# Patient Record
Sex: Female | Born: 1950
Health system: Southern US, Community
[De-identification: ages and names within clinical notes are randomized; demographics above are authoritative.]

## PROBLEM LIST (undated history)

## (undated) DIAGNOSIS — M792 Neuralgia and neuritis, unspecified: Secondary | ICD-10-CM

## (undated) DIAGNOSIS — N200 Calculus of kidney: Secondary | ICD-10-CM

## (undated) HISTORY — DX: Neuralgia and neuritis, unspecified: M79.2

## (undated) HISTORY — PX: HYSTEROTOMY: SHX1776

## (undated) HISTORY — DX: Calculus of kidney: N20.0

## (undated) HISTORY — PX: ECTOPIC PREGNANCY SURGERY: SHX613

## (undated) HISTORY — PX: OTHER SURGICAL HISTORY: SHX169

---

## 1998-08-27 ENCOUNTER — Encounter: Payer: Self-pay | Admitting: Family Medicine

## 1998-08-28 ENCOUNTER — Inpatient Hospital Stay (HOSPITAL_COMMUNITY): Admission: EM | Admit: 1998-08-28 | Discharge: 1998-08-30 | Payer: Self-pay | Admitting: Emergency Medicine

## 1998-08-28 ENCOUNTER — Encounter: Payer: Self-pay | Admitting: Family Medicine

## 1999-05-17 ENCOUNTER — Encounter: Admission: RE | Admit: 1999-05-17 | Discharge: 1999-05-17 | Payer: Self-pay | Admitting: Urology

## 1999-05-17 ENCOUNTER — Encounter: Payer: Self-pay | Admitting: Urology

## 2005-02-27 ENCOUNTER — Other Ambulatory Visit: Admission: RE | Admit: 2005-02-27 | Discharge: 2005-02-27 | Payer: Self-pay | Admitting: Obstetrics and Gynecology

## 2006-05-28 ENCOUNTER — Other Ambulatory Visit: Admission: RE | Admit: 2006-05-28 | Discharge: 2006-05-28 | Payer: Self-pay | Admitting: Obstetrics and Gynecology

## 2008-11-24 ENCOUNTER — Other Ambulatory Visit: Admission: RE | Admit: 2008-11-24 | Discharge: 2008-11-24 | Payer: Self-pay | Admitting: Obstetrics and Gynecology

## 2011-10-19 ENCOUNTER — Other Ambulatory Visit (HOSPITAL_COMMUNITY)
Admission: RE | Admit: 2011-10-19 | Discharge: 2011-10-19 | Disposition: A | Discharge status: Home / Self Care | Payer: BC Managed Care – PPO | Source: Ambulatory Visit | Attending: Family Medicine | Admitting: Family Medicine

## 2011-10-19 DIAGNOSIS — Z01419 Encounter for gynecological examination (general) (routine) without abnormal findings: Secondary | ICD-10-CM | POA: Insufficient documentation

## 2011-10-23 ENCOUNTER — Other Ambulatory Visit: Payer: Self-pay | Admitting: Family Medicine

## 2012-10-10 ENCOUNTER — Other Ambulatory Visit (HOSPITAL_BASED_OUTPATIENT_CLINIC_OR_DEPARTMENT_OTHER): Payer: Self-pay | Admitting: *Deleted

## 2012-10-10 DIAGNOSIS — Z1231 Encounter for screening mammogram for malignant neoplasm of breast: Secondary | ICD-10-CM

## 2012-10-11 ENCOUNTER — Ambulatory Visit (HOSPITAL_BASED_OUTPATIENT_CLINIC_OR_DEPARTMENT_OTHER)
Admission: RE | Admit: 2012-10-11 | Discharge: 2012-10-11 | Disposition: A | Discharge status: Home / Self Care | Payer: 59 | Source: Ambulatory Visit | Attending: Family Medicine | Admitting: Family Medicine

## 2012-10-11 ENCOUNTER — Inpatient Hospital Stay (HOSPITAL_BASED_OUTPATIENT_CLINIC_OR_DEPARTMENT_OTHER): Admission: RE | Admit: 2012-10-11 | Payer: 59 | Source: Ambulatory Visit

## 2012-10-11 ENCOUNTER — Other Ambulatory Visit (HOSPITAL_BASED_OUTPATIENT_CLINIC_OR_DEPARTMENT_OTHER): Payer: Self-pay | Admitting: Family Medicine

## 2012-10-11 DIAGNOSIS — Z1231 Encounter for screening mammogram for malignant neoplasm of breast: Secondary | ICD-10-CM

## 2014-10-12 ENCOUNTER — Other Ambulatory Visit (HOSPITAL_BASED_OUTPATIENT_CLINIC_OR_DEPARTMENT_OTHER): Payer: Self-pay | Admitting: Family Medicine

## 2014-10-12 DIAGNOSIS — Z1231 Encounter for screening mammogram for malignant neoplasm of breast: Secondary | ICD-10-CM

## 2014-10-13 ENCOUNTER — Ambulatory Visit (HOSPITAL_BASED_OUTPATIENT_CLINIC_OR_DEPARTMENT_OTHER)
Admission: RE | Admit: 2014-10-13 | Discharge: 2014-10-13 | Disposition: A | Discharge status: Home / Self Care | Payer: 59 | Source: Ambulatory Visit | Attending: Family Medicine | Admitting: Family Medicine

## 2014-10-13 DIAGNOSIS — Z1231 Encounter for screening mammogram for malignant neoplasm of breast: Secondary | ICD-10-CM | POA: Diagnosis not present

## 2014-10-15 ENCOUNTER — Ambulatory Visit (HOSPITAL_BASED_OUTPATIENT_CLINIC_OR_DEPARTMENT_OTHER): Payer: Self-pay

## 2014-10-15 ENCOUNTER — Ambulatory Visit (HOSPITAL_BASED_OUTPATIENT_CLINIC_OR_DEPARTMENT_OTHER): Payer: 59

## 2015-06-10 ENCOUNTER — Other Ambulatory Visit: Payer: Self-pay | Admitting: Family Medicine

## 2015-06-10 DIAGNOSIS — R1030 Lower abdominal pain, unspecified: Secondary | ICD-10-CM

## 2015-06-10 DIAGNOSIS — R635 Abnormal weight gain: Secondary | ICD-10-CM

## 2015-06-21 ENCOUNTER — Ambulatory Visit
Admission: RE | Admit: 2015-06-21 | Discharge: 2015-06-21 | Disposition: A | Discharge status: Home / Self Care | Payer: 59 | Source: Ambulatory Visit | Attending: Family Medicine | Admitting: Family Medicine

## 2015-06-21 ENCOUNTER — Other Ambulatory Visit: Payer: Self-pay | Admitting: Family Medicine

## 2015-06-21 DIAGNOSIS — R635 Abnormal weight gain: Secondary | ICD-10-CM

## 2015-06-21 DIAGNOSIS — R1030 Lower abdominal pain, unspecified: Secondary | ICD-10-CM

## 2015-09-29 ENCOUNTER — Encounter: Payer: Self-pay | Admitting: Neurology

## 2015-09-29 ENCOUNTER — Ambulatory Visit (INDEPENDENT_AMBULATORY_CARE_PROVIDER_SITE_OTHER): Payer: 59 | Admitting: Neurology

## 2015-09-29 VITALS — BP 130/86 | HR 80 | Resp 20 | Ht 65.0 in | Wt 169.0 lb

## 2015-09-29 DIAGNOSIS — H5713 Ocular pain, bilateral: Secondary | ICD-10-CM

## 2015-09-29 DIAGNOSIS — H5702 Anisocoria: Secondary | ICD-10-CM | POA: Diagnosis not present

## 2015-09-29 DIAGNOSIS — F4322 Adjustment disorder with anxiety: Secondary | ICD-10-CM

## 2015-09-29 NOTE — Progress Notes (Signed)
CLINIC   Provider:  Larey Seat, M D  Referring Provider: No ref. provider found Primary Care Physician:  Vena Austria, MD  Chief Complaint  Patient presents with  . New Patient (Initial Visit)    fixed pupil, eye pain, r eye    HPI:  Janet Martin is a 65 y.o. female , seen here as a referral from Dr. Posey Pronto , OD at [facility].   Janet Martin is here today after her ophthalmologist noticed a pupillary asymmetry in a repeat visit. Apparently upon pupil had been smaller than the other for while there was no associated pain or other discomfort with it but then the patient developed eye pain with eye movement as well as retro-orbital pressure pain. She was reevaluated and at this time it seemed to be judicious to evaluate the pupillary asymmetry in light of the associated symptoms. The patient has a fixed non-light reactive pupil on the right versus a round reactive pupil on the left the pupil was dilated and dilation by to pick her mind did not show abnormality except a slightly decreased tear film the lens showed a 2+ nuclear sclerosis on the right which is fairly symmetric to the left. The macula was clear normal periphery and flat optic nerve. The diagnosis was a vitreous degeneration, this is monitored with visits every 2 months. #2 cataract nuclear at this time also just an observation the patient will use UV protection when in bright light. #3 fixed pupil on the right had been previously noted 6 or 7 years ago and seems to have not progressed from there but no a new pain component has evolved. Neurologic evaluation was requested.   No family history of eye diseases. No trauma or surgery to the eyes.   Social history:  No smoker, ETOH , social one a week , Caffeine use :  2 cups a day.  Worked in an office with  Neon light. computer work. No  exposure to natural light at the workplace.   Review of Systems: Out of a complete 14 system review, the patient  complains of only the following symptoms, and all other reviewed systems are negative.  Eye pain.   Social History   Social History  . Marital status: Single    Spouse name: N/A  . Number of children: N/A  . Years of education: N/A   Occupational History  . Not on file.   Social History Main Topics  . Smoking status: Never Smoker  . Smokeless tobacco: Never Used  . Alcohol use Yes     Comment: social  . Drug use: No  . Sexual activity: Not on file   Other Topics Concern  . Not on file   Social History Narrative  . No narrative on file    Family History  Problem Relation Age of Onset  . Hypertension Mother   . Stroke Mother   . Cataracts Father   . Hypertension Father   . Diabetes Father     Past Medical History:  Diagnosis Date  . Kidney stone   . Nerve pain    in legs, takes citalopram    Past Surgical History:  Procedure Laterality Date  . ECTOPIC PREGNANCY SURGERY    . HYSTEROTOMY    . scar tissue removal      Current Outpatient Prescriptions  Medication Sig Dispense Refill  . ALPRAZolam (XANAX) 0.25 MG tablet Take 0.25 mg by mouth at bedtime as needed for anxiety. For cramps    .  citalopram (CELEXA) 20 MG tablet Take 20 mg by mouth daily.     No current facility-administered medications for this visit.     Allergies as of 09/29/2015 - Review Complete 09/29/2015  Allergen Reaction Noted  . Contrast media [iodinated diagnostic agents] Hives and Itching 06/21/1999  . Aspirin Nausea Only 04/10/2015  . Codeine  09/29/2015  . Penicillins  09/29/2015  . Sulfa antibiotics  09/29/2015    Vitals: BP 130/86   Pulse 80   Resp 20   Ht 5\' 5"  (1.651 m)   Wt 169 lb (76.7 kg)   BMI 28.12 kg/m  Last Weight:  Wt Readings from Last 1 Encounters:  09/29/15 169 lb (76.7 kg)   [id number] mass index is 28.12 kg/m.     Last Height:   Ht Readings from Last 1 Encounters:  09/29/15 5\' 5"  (1.651 m)    Physical exam:  General: The patient is awake, alert  and appears not in acute distress. The patient is well groomed. Head: Normocephalic, atraumatic.  She has felt a head and jaw pain radiating  onto the left retroauricular region.  Sharp pain . Non constant . Cardiovascular:  Regular rate and rhythm , without  murmurs or carotid bruit, and without distended neck veins. Respiratory: Lungs are clear to auscultation. Skin:  Without evidence of edema, or rash Trunk: The patient's posture is erect.   Neurologic exam : The patient is awake and alert, oriented to place and time.   Memory subjective described as intact.  Attention span & concentration ability appears normal.  Speech is fluent,  without  dysarthria, dysphonia or aphasia.  Mood and affect are appropriate.  Cranial nerves: Pupils are inequally, the left is about 2 mm and the right 4 mm wide. Both appear round, no enophthalmos and no  assymetric eye lid closer  and briskly reactive to light.  Funduscopic exam without evidence of pallor or edema. Extraocular movements  in vertical and horizontal planes intact and without nystagmus. Visual fields by finger perimetry are intact.  hearing loss on the left   Facial sensation intact to fine touch.  Facial motor strength is symmetric and tongue and uvula move midline. Shoulder shrug was symmetrical.   Motor exam:  Normal tone, muscle bulk and symmetric strength in all extremities. Equal shoulder shrug, tension pain.  Sensory:  Fine touch, pinprick and vibration /Proprioception tested in the upper extremities was normal. Coordination: Rapid alternating movements /Finger-to-nose maneuver  normal without evidence of ataxia, dysmetria . She hs bilaterally noticeable finger tremor. No cogwheeling.  Gait and station: Patient walks without assistive device and is able unassisted to climb up to the exam table. Strength within normal limits.  Stance is stable and normal.   Deep tendon reflexes: in the  upper and lower extremities are symmetric and  intact.   The patient was advised of the nature of the diagnosed sleep disorder , the treatment options and risks for general a health and wellness arising from not treating the condition.  I spent more than  45  minutes of face to face time with the patient. Greater than 50% of time was spent in counseling and coordination of care. We have discussed the diagnosis and differential and I answered the patient's questions.     Assessment:  After physical and neurologic examination, review of laboratory studies,  Personal review of imaging studies, reports of other /same  Imaging studies ,  Results of polysomnography/ neurophysiology testing and pre-existing records as far as provided  in visit., my assessment is   1) Janet Martin presents with a asymmetry of the pupils, the left pupil being about 2 mm white the right 4 mm wide the left not being reactive to light but being round- she does not present with ptosis or enophthalmos and she does not have an asymmetric facial feature or reports sweating or flushing of one side of the face only. She does have dry eyes sometimes she has noticed an ear pain newly she has noticed a retro-orbital pressure and the pain in the eye that sometimes is very intense but not lasting longer than 10-12 minutes.  she has not lost vision.  Sometimes she noticed blurring, non persistent.   2) I will evaluate this patient by carotid doppler and brain MRI-   3) offer medication for possible vascular eye pain. ASA  EC 81 mg  daily     Plan:  Rv in 3-6 weeks.    Asencion Partridge Preslie Depasquale MD  09/29/2015

## 2015-09-29 NOTE — Addendum Note (Signed)
Addended by: Larey Seat on: 09/29/2015 02:56 PM   Modules accepted: Orders

## 2015-09-30 LAB — COMPREHENSIVE METABOLIC PANEL
ALK PHOS: 67 IU/L (ref 39–117)
ALT: 19 IU/L (ref 0–32)
AST: 19 IU/L (ref 0–40)
Albumin/Globulin Ratio: 1.5 (ref 1.2–2.2)
Albumin: 4.7 g/dL (ref 3.6–4.8)
BUN/Creatinine Ratio: 16 (ref 12–28)
BUN: 14 mg/dL (ref 8–27)
Bilirubin Total: 0.3 mg/dL (ref 0.0–1.2)
CHLORIDE: 100 mmol/L (ref 96–106)
CO2: 28 mmol/L (ref 18–29)
CREATININE: 0.85 mg/dL (ref 0.57–1.00)
Calcium: 10.7 mg/dL — ABNORMAL HIGH (ref 8.7–10.3)
GFR calc Af Amer: 84 mL/min/{1.73_m2} (ref >59)
GFR calc non Af Amer: 73 mL/min/{1.73_m2} (ref >59)
GLOBULIN, TOTAL: 3.1 g/dL (ref 1.5–4.5)
GLUCOSE: 90 mg/dL (ref 65–99)
Potassium: 4.6 mmol/L (ref 3.5–5.2)
Sodium: 144 mmol/L (ref 134–144)
Total Protein: 7.8 g/dL (ref 6.0–8.5)

## 2015-10-04 ENCOUNTER — Telehealth: Payer: Self-pay | Admitting: Neurology

## 2015-10-04 NOTE — Telephone Encounter (Signed)
Janet Martin. Patient is ready to be scheduled for Doppler. No PA needed. Thanks Hinton Dyer.

## 2015-10-05 ENCOUNTER — Telehealth: Payer: Self-pay

## 2015-10-05 NOTE — Telephone Encounter (Signed)
Larey Seat, MD  Lester [place], RN        Normal CMET   I spoke to patient and she is aware of results.

## 2015-10-07 ENCOUNTER — Ambulatory Visit (INDEPENDENT_AMBULATORY_CARE_PROVIDER_SITE_OTHER): Payer: 59

## 2015-10-07 DIAGNOSIS — H5713 Ocular pain, bilateral: Secondary | ICD-10-CM

## 2015-10-07 DIAGNOSIS — H5702 Anisocoria: Secondary | ICD-10-CM

## 2015-10-07 DIAGNOSIS — F4322 Adjustment disorder with anxiety: Secondary | ICD-10-CM

## 2015-10-11 ENCOUNTER — Other Ambulatory Visit (HOSPITAL_BASED_OUTPATIENT_CLINIC_OR_DEPARTMENT_OTHER): Payer: Self-pay | Admitting: Family Medicine

## 2015-10-11 DIAGNOSIS — Z1231 Encounter for screening mammogram for malignant neoplasm of breast: Secondary | ICD-10-CM

## 2015-10-13 ENCOUNTER — Ambulatory Visit (HOSPITAL_BASED_OUTPATIENT_CLINIC_OR_DEPARTMENT_OTHER)
Admission: RE | Admit: 2015-10-13 | Discharge: 2015-10-13 | Disposition: A | Discharge status: Home / Self Care | Payer: 59 | Source: Ambulatory Visit | Attending: Family Medicine | Admitting: Family Medicine

## 2015-10-13 DIAGNOSIS — Z1231 Encounter for screening mammogram for malignant neoplasm of breast: Secondary | ICD-10-CM | POA: Diagnosis not present

## 2015-10-18 ENCOUNTER — Telehealth: Payer: Self-pay

## 2015-10-18 DIAGNOSIS — F4322 Adjustment disorder with anxiety: Secondary | ICD-10-CM

## 2015-10-18 DIAGNOSIS — H5702 Anisocoria: Secondary | ICD-10-CM

## 2015-10-18 DIAGNOSIS — H5713 Ocular pain, bilateral: Secondary | ICD-10-CM

## 2015-10-18 NOTE — Telephone Encounter (Signed)
I spoke to pt and advised her that her carotid doppler study was normal (per Dr. Brett Fairy). Pt verbalized understanding of results.  Pt says that she has not heard if her insurance has approved her MRI yet and is wondering if we had heard anything. I advised her that I would send this message to [facility]. Pt verbalized understanding.

## 2015-10-19 ENCOUNTER — Other Ambulatory Visit: Payer: Self-pay | Admitting: Family Medicine

## 2015-10-19 DIAGNOSIS — R928 Other abnormal and inconclusive findings on diagnostic imaging of breast: Secondary | ICD-10-CM

## 2015-10-20 NOTE — Telephone Encounter (Signed)
I spoke to Dr. Brett Fairy. She is agreeable to this pt having an MRI WO contrast. Order placed.

## 2015-10-20 NOTE — Telephone Encounter (Signed)
Spoke with patient, she cannot have an MRI with contrast. The order will need to be changed to without if you want to proceed with order.

## 2015-10-22 ENCOUNTER — Telehealth: Payer: Self-pay | Admitting: Neurology

## 2015-10-22 NOTE — Telephone Encounter (Signed)
Pt called in to check on MRI order since it was changed from w/contrast to w/o contrast. Please call to give her an update. Thank you

## 2015-10-28 ENCOUNTER — Ambulatory Visit
Admission: RE | Admit: 2015-10-28 | Discharge: 2015-10-28 | Disposition: A | Discharge status: Home / Self Care | Payer: 59 | Source: Ambulatory Visit | Attending: Family Medicine | Admitting: Family Medicine

## 2015-10-28 DIAGNOSIS — R928 Other abnormal and inconclusive findings on diagnostic imaging of breast: Secondary | ICD-10-CM

## 2015-11-01 ENCOUNTER — Ambulatory Visit: Payer: 59 | Admitting: Neurology

## 2015-11-05 NOTE — Telephone Encounter (Signed)
Called and scheduled patient

## 2015-11-08 NOTE — Telephone Encounter (Signed)
Spoke with patient who stated she wanted to cancel MRI due to cost.

## 2015-11-10 ENCOUNTER — Other Ambulatory Visit: Payer: 59

## 2015-12-23 ENCOUNTER — Telehealth: Payer: Self-pay | Admitting: *Deleted

## 2015-12-23 NOTE — Telephone Encounter (Signed)
error 

## 2016-10-03 ENCOUNTER — Other Ambulatory Visit (HOSPITAL_BASED_OUTPATIENT_CLINIC_OR_DEPARTMENT_OTHER): Payer: Self-pay | Admitting: Family Medicine

## 2016-10-03 DIAGNOSIS — Z1231 Encounter for screening mammogram for malignant neoplasm of breast: Secondary | ICD-10-CM

## 2016-10-10 ENCOUNTER — Ambulatory Visit (HOSPITAL_BASED_OUTPATIENT_CLINIC_OR_DEPARTMENT_OTHER)
Admission: RE | Admit: 2016-10-10 | Discharge: 2016-10-10 | Disposition: A | Discharge status: Home / Self Care | Payer: Medicare Other | Source: Ambulatory Visit | Attending: Family Medicine | Admitting: Family Medicine

## 2016-10-10 DIAGNOSIS — Z1231 Encounter for screening mammogram for malignant neoplasm of breast: Secondary | ICD-10-CM | POA: Diagnosis not present

## 2017-02-01 DIAGNOSIS — L03119 Cellulitis of unspecified part of limb: Secondary | ICD-10-CM | POA: Diagnosis not present

## 2017-02-01 DIAGNOSIS — Z1389 Encounter for screening for other disorder: Secondary | ICD-10-CM | POA: Diagnosis not present

## 2017-02-01 DIAGNOSIS — R69 Illness, unspecified: Secondary | ICD-10-CM | POA: Diagnosis not present

## 2017-02-01 DIAGNOSIS — G2581 Restless legs syndrome: Secondary | ICD-10-CM | POA: Diagnosis not present

## 2017-03-26 DIAGNOSIS — J209 Acute bronchitis, unspecified: Secondary | ICD-10-CM | POA: Diagnosis not present

## 2017-05-07 ENCOUNTER — Other Ambulatory Visit: Payer: Self-pay | Admitting: Family Medicine

## 2017-05-07 ENCOUNTER — Ambulatory Visit
Admission: RE | Admit: 2017-05-07 | Discharge: 2017-05-07 | Disposition: A | Discharge status: Home / Self Care | Payer: 59 | Source: Ambulatory Visit | Attending: Family Medicine | Admitting: Family Medicine

## 2017-05-07 DIAGNOSIS — R053 Chronic cough: Secondary | ICD-10-CM

## 2017-05-07 DIAGNOSIS — R05 Cough: Secondary | ICD-10-CM | POA: Diagnosis not present

## 2017-05-07 DIAGNOSIS — J189 Pneumonia, unspecified organism: Secondary | ICD-10-CM | POA: Diagnosis not present

## 2017-05-29 DIAGNOSIS — R05 Cough: Secondary | ICD-10-CM | POA: Diagnosis not present

## 2017-08-23 DIAGNOSIS — G2581 Restless legs syndrome: Secondary | ICD-10-CM | POA: Diagnosis not present

## 2017-08-23 DIAGNOSIS — Z13220 Encounter for screening for lipoid disorders: Secondary | ICD-10-CM | POA: Diagnosis not present

## 2017-08-23 DIAGNOSIS — Z0000 Encounter for general adult medical examination without abnormal findings: Secondary | ICD-10-CM | POA: Diagnosis not present

## 2017-08-23 DIAGNOSIS — R109 Unspecified abdominal pain: Secondary | ICD-10-CM | POA: Diagnosis not present

## 2017-08-23 DIAGNOSIS — E2839 Other primary ovarian failure: Secondary | ICD-10-CM | POA: Diagnosis not present

## 2017-08-23 DIAGNOSIS — Z136 Encounter for screening for cardiovascular disorders: Secondary | ICD-10-CM | POA: Diagnosis not present

## 2017-08-23 DIAGNOSIS — Z1211 Encounter for screening for malignant neoplasm of colon: Secondary | ICD-10-CM | POA: Diagnosis not present

## 2017-08-23 DIAGNOSIS — L309 Dermatitis, unspecified: Secondary | ICD-10-CM | POA: Diagnosis not present

## 2017-08-23 DIAGNOSIS — E559 Vitamin D deficiency, unspecified: Secondary | ICD-10-CM | POA: Diagnosis not present

## 2017-09-18 DIAGNOSIS — H2510 Age-related nuclear cataract, unspecified eye: Secondary | ICD-10-CM | POA: Diagnosis not present

## 2017-09-18 DIAGNOSIS — H5200 Hypermetropia, unspecified eye: Secondary | ICD-10-CM | POA: Diagnosis not present

## 2017-09-26 DIAGNOSIS — E2839 Other primary ovarian failure: Secondary | ICD-10-CM | POA: Diagnosis not present

## 2017-09-26 DIAGNOSIS — M8588 Other specified disorders of bone density and structure, other site: Secondary | ICD-10-CM | POA: Diagnosis not present

## 2017-10-16 ENCOUNTER — Other Ambulatory Visit (HOSPITAL_BASED_OUTPATIENT_CLINIC_OR_DEPARTMENT_OTHER): Payer: Self-pay | Admitting: Family Medicine

## 2017-10-16 ENCOUNTER — Ambulatory Visit (HOSPITAL_BASED_OUTPATIENT_CLINIC_OR_DEPARTMENT_OTHER)
Admission: RE | Admit: 2017-10-16 | Discharge: 2017-10-16 | Disposition: A | Discharge status: Home / Self Care | Payer: Medicare HMO | Source: Ambulatory Visit | Attending: Family Medicine | Admitting: Family Medicine

## 2017-10-16 DIAGNOSIS — Z1231 Encounter for screening mammogram for malignant neoplasm of breast: Secondary | ICD-10-CM | POA: Diagnosis not present

## 2017-10-17 ENCOUNTER — Other Ambulatory Visit: Payer: Self-pay | Admitting: Family Medicine

## 2017-10-17 DIAGNOSIS — R928 Other abnormal and inconclusive findings on diagnostic imaging of breast: Secondary | ICD-10-CM

## 2017-10-19 ENCOUNTER — Ambulatory Visit: Payer: Medicare HMO

## 2017-10-19 ENCOUNTER — Ambulatory Visit
Admission: RE | Admit: 2017-10-19 | Discharge: 2017-10-19 | Disposition: A | Discharge status: Home / Self Care | Payer: Medicare HMO | Source: Ambulatory Visit | Attending: Family Medicine | Admitting: Family Medicine

## 2017-10-19 DIAGNOSIS — R928 Other abnormal and inconclusive findings on diagnostic imaging of breast: Secondary | ICD-10-CM

## 2018-07-17 DIAGNOSIS — R69 Illness, unspecified: Secondary | ICD-10-CM | POA: Diagnosis not present

## 2018-07-23 DIAGNOSIS — R109 Unspecified abdominal pain: Secondary | ICD-10-CM | POA: Diagnosis not present

## 2018-07-23 DIAGNOSIS — R110 Nausea: Secondary | ICD-10-CM | POA: Diagnosis not present

## 2018-07-23 DIAGNOSIS — R69 Illness, unspecified: Secondary | ICD-10-CM | POA: Diagnosis not present

## 2018-08-12 DIAGNOSIS — R109 Unspecified abdominal pain: Secondary | ICD-10-CM | POA: Diagnosis not present

## 2018-08-12 DIAGNOSIS — R5383 Other fatigue: Secondary | ICD-10-CM | POA: Diagnosis not present

## 2018-08-12 DIAGNOSIS — Z86010 Personal history of colonic polyps: Secondary | ICD-10-CM | POA: Diagnosis not present

## 2018-08-12 DIAGNOSIS — Z13220 Encounter for screening for lipoid disorders: Secondary | ICD-10-CM | POA: Diagnosis not present

## 2018-08-13 DIAGNOSIS — E559 Vitamin D deficiency, unspecified: Secondary | ICD-10-CM | POA: Diagnosis not present

## 2018-08-13 DIAGNOSIS — R5383 Other fatigue: Secondary | ICD-10-CM | POA: Diagnosis not present

## 2018-08-13 DIAGNOSIS — Z13220 Encounter for screening for lipoid disorders: Secondary | ICD-10-CM | POA: Diagnosis not present

## 2018-08-13 DIAGNOSIS — E782 Mixed hyperlipidemia: Secondary | ICD-10-CM | POA: Diagnosis not present

## 2018-08-29 DIAGNOSIS — Z1389 Encounter for screening for other disorder: Secondary | ICD-10-CM | POA: Diagnosis not present

## 2018-08-29 DIAGNOSIS — M8588 Other specified disorders of bone density and structure, other site: Secondary | ICD-10-CM | POA: Diagnosis not present

## 2018-08-29 DIAGNOSIS — L309 Dermatitis, unspecified: Secondary | ICD-10-CM | POA: Diagnosis not present

## 2018-08-29 DIAGNOSIS — E559 Vitamin D deficiency, unspecified: Secondary | ICD-10-CM | POA: Diagnosis not present

## 2018-08-29 DIAGNOSIS — G2581 Restless legs syndrome: Secondary | ICD-10-CM | POA: Diagnosis not present

## 2018-08-29 DIAGNOSIS — Z1211 Encounter for screening for malignant neoplasm of colon: Secondary | ICD-10-CM | POA: Diagnosis not present

## 2018-08-29 DIAGNOSIS — Q2732 Arteriovenous malformation of vessel of lower limb: Secondary | ICD-10-CM | POA: Diagnosis not present

## 2018-08-29 DIAGNOSIS — E2839 Other primary ovarian failure: Secondary | ICD-10-CM | POA: Diagnosis not present

## 2018-08-29 DIAGNOSIS — Z0000 Encounter for general adult medical examination without abnormal findings: Secondary | ICD-10-CM | POA: Diagnosis not present

## 2018-09-10 DIAGNOSIS — M79605 Pain in left leg: Secondary | ICD-10-CM | POA: Diagnosis not present

## 2018-09-10 DIAGNOSIS — G2581 Restless legs syndrome: Secondary | ICD-10-CM | POA: Diagnosis not present

## 2018-09-10 DIAGNOSIS — M79604 Pain in right leg: Secondary | ICD-10-CM | POA: Diagnosis not present

## 2018-09-10 DIAGNOSIS — I8312 Varicose veins of left lower extremity with inflammation: Secondary | ICD-10-CM | POA: Diagnosis not present

## 2018-09-18 DIAGNOSIS — I8312 Varicose veins of left lower extremity with inflammation: Secondary | ICD-10-CM | POA: Diagnosis not present

## 2018-09-18 DIAGNOSIS — I8311 Varicose veins of right lower extremity with inflammation: Secondary | ICD-10-CM | POA: Diagnosis not present

## 2018-10-07 DIAGNOSIS — I8311 Varicose veins of right lower extremity with inflammation: Secondary | ICD-10-CM | POA: Diagnosis not present

## 2018-10-07 DIAGNOSIS — I8312 Varicose veins of left lower extremity with inflammation: Secondary | ICD-10-CM | POA: Diagnosis not present

## 2018-10-07 DIAGNOSIS — I83813 Varicose veins of bilateral lower extremities with pain: Secondary | ICD-10-CM | POA: Diagnosis not present

## 2018-10-14 DIAGNOSIS — L814 Other melanin hyperpigmentation: Secondary | ICD-10-CM | POA: Diagnosis not present

## 2018-10-14 DIAGNOSIS — D1801 Hemangioma of skin and subcutaneous tissue: Secondary | ICD-10-CM | POA: Diagnosis not present

## 2018-10-14 DIAGNOSIS — L821 Other seborrheic keratosis: Secondary | ICD-10-CM | POA: Diagnosis not present

## 2018-10-14 DIAGNOSIS — L820 Inflamed seborrheic keratosis: Secondary | ICD-10-CM | POA: Diagnosis not present

## 2018-10-14 DIAGNOSIS — L579 Skin changes due to chronic exposure to nonionizing radiation, unspecified: Secondary | ICD-10-CM | POA: Diagnosis not present

## 2018-10-14 DIAGNOSIS — L570 Actinic keratosis: Secondary | ICD-10-CM | POA: Diagnosis not present

## 2018-10-31 DIAGNOSIS — Z20828 Contact with and (suspected) exposure to other viral communicable diseases: Secondary | ICD-10-CM | POA: Diagnosis not present

## 2018-11-18 DIAGNOSIS — I8311 Varicose veins of right lower extremity with inflammation: Secondary | ICD-10-CM | POA: Diagnosis not present

## 2018-11-18 DIAGNOSIS — I8312 Varicose veins of left lower extremity with inflammation: Secondary | ICD-10-CM | POA: Diagnosis not present

## 2018-11-18 DIAGNOSIS — M79605 Pain in left leg: Secondary | ICD-10-CM | POA: Diagnosis not present

## 2018-11-18 DIAGNOSIS — M79604 Pain in right leg: Secondary | ICD-10-CM | POA: Diagnosis not present

## 2018-12-10 DIAGNOSIS — R69 Illness, unspecified: Secondary | ICD-10-CM | POA: Diagnosis not present

## 2019-01-24 DIAGNOSIS — Z20828 Contact with and (suspected) exposure to other viral communicable diseases: Secondary | ICD-10-CM | POA: Diagnosis not present

## 2019-02-05 DIAGNOSIS — Z20822 Contact with and (suspected) exposure to covid-19: Secondary | ICD-10-CM | POA: Diagnosis not present

## 2019-03-13 DIAGNOSIS — I83892 Varicose veins of left lower extremities with other complications: Secondary | ICD-10-CM | POA: Diagnosis not present

## 2019-03-13 DIAGNOSIS — I8312 Varicose veins of left lower extremity with inflammation: Secondary | ICD-10-CM | POA: Diagnosis not present

## 2019-03-17 DIAGNOSIS — I8312 Varicose veins of left lower extremity with inflammation: Secondary | ICD-10-CM | POA: Diagnosis not present

## 2019-04-04 DIAGNOSIS — I83891 Varicose veins of right lower extremities with other complications: Secondary | ICD-10-CM | POA: Diagnosis not present

## 2019-04-04 DIAGNOSIS — I8312 Varicose veins of left lower extremity with inflammation: Secondary | ICD-10-CM | POA: Diagnosis not present

## 2019-04-07 DIAGNOSIS — I8311 Varicose veins of right lower extremity with inflammation: Secondary | ICD-10-CM | POA: Diagnosis not present

## 2019-04-07 DIAGNOSIS — I83811 Varicose veins of right lower extremities with pain: Secondary | ICD-10-CM | POA: Diagnosis not present

## 2019-04-10 DIAGNOSIS — I8312 Varicose veins of left lower extremity with inflammation: Secondary | ICD-10-CM | POA: Diagnosis not present

## 2019-04-17 DIAGNOSIS — I8311 Varicose veins of right lower extremity with inflammation: Secondary | ICD-10-CM | POA: Diagnosis not present

## 2019-04-18 DIAGNOSIS — R350 Frequency of micturition: Secondary | ICD-10-CM | POA: Diagnosis not present

## 2019-05-02 DIAGNOSIS — I8311 Varicose veins of right lower extremity with inflammation: Secondary | ICD-10-CM | POA: Diagnosis not present

## 2019-05-02 DIAGNOSIS — I83893 Varicose veins of bilateral lower extremities with other complications: Secondary | ICD-10-CM | POA: Diagnosis not present

## 2019-05-02 DIAGNOSIS — I83891 Varicose veins of right lower extremities with other complications: Secondary | ICD-10-CM | POA: Diagnosis not present

## 2019-05-16 DIAGNOSIS — I83812 Varicose veins of left lower extremities with pain: Secondary | ICD-10-CM | POA: Diagnosis not present

## 2019-05-16 DIAGNOSIS — I8312 Varicose veins of left lower extremity with inflammation: Secondary | ICD-10-CM | POA: Diagnosis not present

## 2019-06-02 DIAGNOSIS — K5900 Constipation, unspecified: Secondary | ICD-10-CM | POA: Diagnosis not present

## 2019-06-02 DIAGNOSIS — Z800 Family history of malignant neoplasm of digestive organs: Secondary | ICD-10-CM | POA: Diagnosis not present

## 2019-06-02 DIAGNOSIS — Z86010 Personal history of colonic polyps: Secondary | ICD-10-CM | POA: Diagnosis not present

## 2019-06-02 DIAGNOSIS — R1013 Epigastric pain: Secondary | ICD-10-CM | POA: Diagnosis not present

## 2019-06-02 DIAGNOSIS — R109 Unspecified abdominal pain: Secondary | ICD-10-CM | POA: Diagnosis not present

## 2019-06-06 ENCOUNTER — Ambulatory Visit
Admission: RE | Admit: 2019-06-06 | Discharge: 2019-06-06 | Disposition: A | Discharge status: Home / Self Care | Payer: Medicare HMO | Source: Ambulatory Visit | Attending: Physician Assistant | Admitting: Physician Assistant

## 2019-06-06 ENCOUNTER — Other Ambulatory Visit: Payer: Self-pay | Admitting: Physician Assistant

## 2019-06-06 DIAGNOSIS — R109 Unspecified abdominal pain: Secondary | ICD-10-CM

## 2019-06-06 DIAGNOSIS — L97221 Non-pressure chronic ulcer of left calf limited to breakdown of skin: Secondary | ICD-10-CM | POA: Diagnosis not present

## 2019-06-06 DIAGNOSIS — I8311 Varicose veins of right lower extremity with inflammation: Secondary | ICD-10-CM | POA: Diagnosis not present

## 2019-06-06 DIAGNOSIS — M7981 Nontraumatic hematoma of soft tissue: Secondary | ICD-10-CM | POA: Diagnosis not present

## 2019-06-06 DIAGNOSIS — L97211 Non-pressure chronic ulcer of right calf limited to breakdown of skin: Secondary | ICD-10-CM | POA: Diagnosis not present

## 2019-06-06 DIAGNOSIS — R140 Abdominal distension (gaseous): Secondary | ICD-10-CM | POA: Diagnosis not present

## 2019-07-08 ENCOUNTER — Other Ambulatory Visit: Payer: Self-pay | Admitting: Family Medicine

## 2019-07-08 ENCOUNTER — Other Ambulatory Visit: Payer: Self-pay

## 2019-07-08 ENCOUNTER — Ambulatory Visit
Admission: RE | Admit: 2019-07-08 | Discharge: 2019-07-08 | Disposition: A | Discharge status: Home / Self Care | Payer: Medicare HMO | Source: Ambulatory Visit | Attending: Family Medicine | Admitting: Family Medicine

## 2019-07-08 DIAGNOSIS — N200 Calculus of kidney: Secondary | ICD-10-CM | POA: Diagnosis not present

## 2019-07-08 DIAGNOSIS — R109 Unspecified abdominal pain: Secondary | ICD-10-CM

## 2019-07-08 DIAGNOSIS — K5732 Diverticulitis of large intestine without perforation or abscess without bleeding: Secondary | ICD-10-CM | POA: Diagnosis not present

## 2019-07-08 DIAGNOSIS — R1032 Left lower quadrant pain: Secondary | ICD-10-CM | POA: Diagnosis not present

## 2019-09-30 DIAGNOSIS — Z1152 Encounter for screening for COVID-19: Secondary | ICD-10-CM | POA: Diagnosis not present

## 2019-09-30 DIAGNOSIS — R519 Headache, unspecified: Secondary | ICD-10-CM | POA: Diagnosis not present

## 2019-09-30 DIAGNOSIS — Z20822 Contact with and (suspected) exposure to covid-19: Secondary | ICD-10-CM | POA: Diagnosis not present

## 2019-09-30 DIAGNOSIS — J3489 Other specified disorders of nose and nasal sinuses: Secondary | ICD-10-CM | POA: Diagnosis not present

## 2019-10-20 DIAGNOSIS — H5201 Hypermetropia, right eye: Secondary | ICD-10-CM | POA: Diagnosis not present

## 2019-10-20 DIAGNOSIS — H52222 Regular astigmatism, left eye: Secondary | ICD-10-CM | POA: Diagnosis not present

## 2019-10-20 DIAGNOSIS — H5202 Hypermetropia, left eye: Secondary | ICD-10-CM | POA: Diagnosis not present

## 2019-10-20 DIAGNOSIS — H524 Presbyopia: Secondary | ICD-10-CM | POA: Diagnosis not present

## 2019-10-21 DIAGNOSIS — L239 Allergic contact dermatitis, unspecified cause: Secondary | ICD-10-CM | POA: Diagnosis not present

## 2019-11-21 DIAGNOSIS — E7800 Pure hypercholesterolemia, unspecified: Secondary | ICD-10-CM | POA: Diagnosis not present

## 2019-11-21 DIAGNOSIS — Z1389 Encounter for screening for other disorder: Secondary | ICD-10-CM | POA: Diagnosis not present

## 2019-11-21 DIAGNOSIS — E559 Vitamin D deficiency, unspecified: Secondary | ICD-10-CM | POA: Diagnosis not present

## 2019-11-21 DIAGNOSIS — M8588 Other specified disorders of bone density and structure, other site: Secondary | ICD-10-CM | POA: Diagnosis not present

## 2019-11-21 DIAGNOSIS — G2581 Restless legs syndrome: Secondary | ICD-10-CM | POA: Diagnosis not present

## 2019-11-21 DIAGNOSIS — E2839 Other primary ovarian failure: Secondary | ICD-10-CM | POA: Diagnosis not present

## 2019-11-21 DIAGNOSIS — Z800 Family history of malignant neoplasm of digestive organs: Secondary | ICD-10-CM | POA: Diagnosis not present

## 2019-11-21 DIAGNOSIS — Z0000 Encounter for general adult medical examination without abnormal findings: Secondary | ICD-10-CM | POA: Diagnosis not present

## 2019-11-21 DIAGNOSIS — L659 Nonscarring hair loss, unspecified: Secondary | ICD-10-CM | POA: Diagnosis not present

## 2019-11-21 DIAGNOSIS — E663 Overweight: Secondary | ICD-10-CM | POA: Diagnosis not present

## 2019-11-26 DIAGNOSIS — M79605 Pain in left leg: Secondary | ICD-10-CM | POA: Diagnosis not present

## 2019-11-26 DIAGNOSIS — M79604 Pain in right leg: Secondary | ICD-10-CM | POA: Diagnosis not present

## 2019-11-26 DIAGNOSIS — R2242 Localized swelling, mass and lump, left lower limb: Secondary | ICD-10-CM | POA: Diagnosis not present

## 2019-11-26 DIAGNOSIS — G2581 Restless legs syndrome: Secondary | ICD-10-CM | POA: Diagnosis not present

## 2019-11-26 DIAGNOSIS — E7800 Pure hypercholesterolemia, unspecified: Secondary | ICD-10-CM | POA: Diagnosis not present

## 2019-11-26 DIAGNOSIS — I700 Atherosclerosis of aorta: Secondary | ICD-10-CM | POA: Diagnosis not present

## 2019-12-08 ENCOUNTER — Ambulatory Visit (INDEPENDENT_AMBULATORY_CARE_PROVIDER_SITE_OTHER): Payer: Medicare HMO

## 2019-12-08 ENCOUNTER — Other Ambulatory Visit: Payer: Self-pay

## 2019-12-08 ENCOUNTER — Ambulatory Visit: Payer: Medicare HMO | Admitting: Podiatry

## 2019-12-08 DIAGNOSIS — R600 Localized edema: Secondary | ICD-10-CM | POA: Diagnosis not present

## 2019-12-08 DIAGNOSIS — D3610 Benign neoplasm of peripheral nerves and autonomic nervous system, unspecified: Secondary | ICD-10-CM

## 2019-12-08 DIAGNOSIS — M6740 Ganglion, unspecified site: Secondary | ICD-10-CM

## 2019-12-08 DIAGNOSIS — M79672 Pain in left foot: Secondary | ICD-10-CM | POA: Diagnosis not present

## 2019-12-09 NOTE — Progress Notes (Signed)
Subjective:   Patient ID: Janet Martin, female   DOB: 69 y.o.   MRN: [id number]   HPI Patient states she has had pain in both feet with cramping and has developed a small mass on the dorsum of the left foot which is been tender and growing and states that she does not remember specific injury and its been present for several months.  Patient does not smoke likes to be active   Review of Systems  All other systems reviewed and are negative.       Objective:  Physical Exam Vitals and nursing note reviewed.  Constitutional:      Appearance: She is well-developed.  Pulmonary:     Effort: Pulmonary effort is normal.  Musculoskeletal:        General: Normal range of motion.  Skin:    General: Skin is warm.  Neurological:     Mental Status: She is alert.     Neurovascular status intact muscle strength adequate range of motion adequate with patient found to have an approximate 8 x 8 mm mass dorsum left foot proximal to the metatarsal second that is moderately painful when pressed and appears to be within subcutaneous tissue and contained.  Cramping of both feet difficult to give complete determination as to why with good digital perfusion noted     Assessment:  Probability for ganglionic cyst dorsum left with possibility for systemic condition creating the chronic cramping that she complains of     Plan:  H&P reviewed all conditions educating her on the cramping and different treatments including tonic water baths or possible potassium supplements.  For the left I did proximal block of the area sterile prep done and using sterile 10 cc syringe I was able to aspirate out about a half a cc of gelatinous fluid which flattened out the area.  Applied sterile dressing with compression will be seen back as needed  X-rays were negative for signs of bony spurs or other bone pathology

## 2019-12-16 ENCOUNTER — Other Ambulatory Visit: Payer: Self-pay | Admitting: Podiatry

## 2019-12-16 ENCOUNTER — Other Ambulatory Visit: Payer: Self-pay | Admitting: Family Medicine

## 2019-12-16 DIAGNOSIS — E2839 Other primary ovarian failure: Secondary | ICD-10-CM

## 2019-12-16 DIAGNOSIS — M6740 Ganglion, unspecified site: Secondary | ICD-10-CM

## 2020-02-06 DIAGNOSIS — R030 Elevated blood-pressure reading, without diagnosis of hypertension: Secondary | ICD-10-CM | POA: Diagnosis not present

## 2020-02-06 DIAGNOSIS — R69 Illness, unspecified: Secondary | ICD-10-CM | POA: Diagnosis not present

## 2020-02-06 DIAGNOSIS — R519 Headache, unspecified: Secondary | ICD-10-CM | POA: Diagnosis not present

## 2020-04-14 ENCOUNTER — Other Ambulatory Visit: Payer: Medicare HMO

## 2020-05-11 ENCOUNTER — Other Ambulatory Visit: Payer: Self-pay

## 2020-05-11 ENCOUNTER — Ambulatory Visit
Admission: RE | Admit: 2020-05-11 | Discharge: 2020-05-11 | Disposition: A | Discharge status: Home / Self Care | Payer: Medicare HMO | Source: Ambulatory Visit | Attending: Family Medicine | Admitting: Family Medicine

## 2020-05-11 DIAGNOSIS — M8589 Other specified disorders of bone density and structure, multiple sites: Secondary | ICD-10-CM | POA: Diagnosis not present

## 2020-05-11 DIAGNOSIS — Z780 Asymptomatic menopausal state: Secondary | ICD-10-CM | POA: Diagnosis not present

## 2020-05-11 DIAGNOSIS — E2839 Other primary ovarian failure: Secondary | ICD-10-CM

## 2020-05-26 DIAGNOSIS — J302 Other seasonal allergic rhinitis: Secondary | ICD-10-CM | POA: Diagnosis not present

## 2020-05-26 DIAGNOSIS — J309 Allergic rhinitis, unspecified: Secondary | ICD-10-CM | POA: Diagnosis not present

## 2020-08-06 DIAGNOSIS — L821 Other seborrheic keratosis: Secondary | ICD-10-CM | POA: Diagnosis not present

## 2020-08-06 DIAGNOSIS — L905 Scar conditions and fibrosis of skin: Secondary | ICD-10-CM | POA: Diagnosis not present

## 2020-08-06 DIAGNOSIS — D1801 Hemangioma of skin and subcutaneous tissue: Secondary | ICD-10-CM | POA: Diagnosis not present

## 2020-08-06 DIAGNOSIS — L738 Other specified follicular disorders: Secondary | ICD-10-CM | POA: Diagnosis not present

## 2020-08-06 DIAGNOSIS — D485 Neoplasm of uncertain behavior of skin: Secondary | ICD-10-CM | POA: Diagnosis not present

## 2020-08-06 DIAGNOSIS — Z85828 Personal history of other malignant neoplasm of skin: Secondary | ICD-10-CM | POA: Diagnosis not present

## 2020-08-20 DIAGNOSIS — E7800 Pure hypercholesterolemia, unspecified: Secondary | ICD-10-CM | POA: Diagnosis not present

## 2020-08-20 DIAGNOSIS — I700 Atherosclerosis of aorta: Secondary | ICD-10-CM | POA: Diagnosis not present

## 2020-08-20 DIAGNOSIS — G2581 Restless legs syndrome: Secondary | ICD-10-CM | POA: Diagnosis not present

## 2020-09-07 ENCOUNTER — Other Ambulatory Visit (HOSPITAL_BASED_OUTPATIENT_CLINIC_OR_DEPARTMENT_OTHER): Payer: Self-pay | Admitting: Family Medicine

## 2020-09-07 DIAGNOSIS — Z1231 Encounter for screening mammogram for malignant neoplasm of breast: Secondary | ICD-10-CM

## 2020-10-05 DIAGNOSIS — D485 Neoplasm of uncertain behavior of skin: Secondary | ICD-10-CM | POA: Diagnosis not present

## 2020-10-05 DIAGNOSIS — R208 Other disturbances of skin sensation: Secondary | ICD-10-CM | POA: Diagnosis not present

## 2020-10-05 DIAGNOSIS — L538 Other specified erythematous conditions: Secondary | ICD-10-CM | POA: Diagnosis not present

## 2020-10-05 DIAGNOSIS — L821 Other seborrheic keratosis: Secondary | ICD-10-CM | POA: Diagnosis not present

## 2020-10-05 DIAGNOSIS — L720 Epidermal cyst: Secondary | ICD-10-CM | POA: Diagnosis not present

## 2020-10-05 DIAGNOSIS — D225 Melanocytic nevi of trunk: Secondary | ICD-10-CM | POA: Diagnosis not present

## 2020-10-05 DIAGNOSIS — L309 Dermatitis, unspecified: Secondary | ICD-10-CM | POA: Diagnosis not present

## 2020-10-05 DIAGNOSIS — L814 Other melanin hyperpigmentation: Secondary | ICD-10-CM | POA: Diagnosis not present

## 2020-10-05 DIAGNOSIS — L02223 Furuncle of chest wall: Secondary | ICD-10-CM | POA: Diagnosis not present

## 2020-10-05 DIAGNOSIS — L820 Inflamed seborrheic keratosis: Secondary | ICD-10-CM | POA: Diagnosis not present

## 2020-10-05 DIAGNOSIS — R58 Hemorrhage, not elsewhere classified: Secondary | ICD-10-CM | POA: Diagnosis not present

## 2020-11-02 ENCOUNTER — Other Ambulatory Visit: Payer: Self-pay

## 2020-11-02 ENCOUNTER — Encounter (HOSPITAL_BASED_OUTPATIENT_CLINIC_OR_DEPARTMENT_OTHER): Payer: Self-pay

## 2020-11-02 ENCOUNTER — Ambulatory Visit (HOSPITAL_BASED_OUTPATIENT_CLINIC_OR_DEPARTMENT_OTHER)
Admission: RE | Admit: 2020-11-02 | Discharge: 2020-11-02 | Disposition: A | Discharge status: Home / Self Care | Payer: Medicare HMO | Source: Ambulatory Visit | Attending: Family Medicine | Admitting: Family Medicine

## 2020-11-02 DIAGNOSIS — Z1231 Encounter for screening mammogram for malignant neoplasm of breast: Secondary | ICD-10-CM | POA: Insufficient documentation

## 2020-11-16 DIAGNOSIS — E559 Vitamin D deficiency, unspecified: Secondary | ICD-10-CM | POA: Diagnosis not present

## 2020-11-16 DIAGNOSIS — Z0000 Encounter for general adult medical examination without abnormal findings: Secondary | ICD-10-CM | POA: Diagnosis not present

## 2020-11-16 DIAGNOSIS — Z1211 Encounter for screening for malignant neoplasm of colon: Secondary | ICD-10-CM | POA: Diagnosis not present

## 2020-11-16 DIAGNOSIS — I700 Atherosclerosis of aorta: Secondary | ICD-10-CM | POA: Diagnosis not present

## 2020-11-16 DIAGNOSIS — E7800 Pure hypercholesterolemia, unspecified: Secondary | ICD-10-CM | POA: Diagnosis not present

## 2020-11-16 DIAGNOSIS — R030 Elevated blood-pressure reading, without diagnosis of hypertension: Secondary | ICD-10-CM | POA: Diagnosis not present

## 2020-11-16 DIAGNOSIS — Z1389 Encounter for screening for other disorder: Secondary | ICD-10-CM | POA: Diagnosis not present

## 2020-11-16 DIAGNOSIS — G2581 Restless legs syndrome: Secondary | ICD-10-CM | POA: Diagnosis not present

## 2020-12-20 ENCOUNTER — Other Ambulatory Visit: Payer: Self-pay | Admitting: Physician Assistant

## 2020-12-20 DIAGNOSIS — R6881 Early satiety: Secondary | ICD-10-CM | POA: Diagnosis not present

## 2020-12-20 DIAGNOSIS — Z86010 Personal history of colonic polyps: Secondary | ICD-10-CM | POA: Diagnosis not present

## 2020-12-20 DIAGNOSIS — K219 Gastro-esophageal reflux disease without esophagitis: Secondary | ICD-10-CM | POA: Diagnosis not present

## 2020-12-20 DIAGNOSIS — Z800 Family history of malignant neoplasm of digestive organs: Secondary | ICD-10-CM | POA: Diagnosis not present

## 2020-12-20 DIAGNOSIS — R1032 Left lower quadrant pain: Secondary | ICD-10-CM | POA: Diagnosis not present

## 2020-12-20 DIAGNOSIS — R140 Abdominal distension (gaseous): Secondary | ICD-10-CM | POA: Diagnosis not present

## 2020-12-20 DIAGNOSIS — Z8719 Personal history of other diseases of the digestive system: Secondary | ICD-10-CM | POA: Diagnosis not present

## 2020-12-22 ENCOUNTER — Institutional Professional Consult (permissible substitution): Payer: Medicare HMO | Admitting: Neurology

## 2020-12-27 ENCOUNTER — Ambulatory Visit
Admission: RE | Admit: 2020-12-27 | Discharge: 2020-12-27 | Disposition: A | Discharge status: Home / Self Care | Payer: Medicare HMO | Source: Ambulatory Visit | Attending: Physician Assistant | Admitting: Physician Assistant

## 2020-12-27 DIAGNOSIS — K5730 Diverticulosis of large intestine without perforation or abscess without bleeding: Secondary | ICD-10-CM | POA: Diagnosis not present

## 2020-12-27 DIAGNOSIS — R1032 Left lower quadrant pain: Secondary | ICD-10-CM

## 2021-01-06 ENCOUNTER — Telehealth (HOSPITAL_BASED_OUTPATIENT_CLINIC_OR_DEPARTMENT_OTHER): Payer: Self-pay

## 2021-04-13 DIAGNOSIS — R634 Abnormal weight loss: Secondary | ICD-10-CM | POA: Diagnosis not present

## 2021-04-13 DIAGNOSIS — R1012 Left upper quadrant pain: Secondary | ICD-10-CM | POA: Diagnosis not present

## 2021-04-13 DIAGNOSIS — K2930 Chronic superficial gastritis without bleeding: Secondary | ICD-10-CM | POA: Diagnosis not present

## 2021-04-13 DIAGNOSIS — Z800 Family history of malignant neoplasm of digestive organs: Secondary | ICD-10-CM | POA: Diagnosis not present

## 2021-04-13 DIAGNOSIS — D122 Benign neoplasm of ascending colon: Secondary | ICD-10-CM | POA: Diagnosis not present

## 2021-04-13 DIAGNOSIS — D120 Benign neoplasm of cecum: Secondary | ICD-10-CM | POA: Diagnosis not present

## 2021-04-13 DIAGNOSIS — D125 Benign neoplasm of sigmoid colon: Secondary | ICD-10-CM | POA: Diagnosis not present

## 2021-04-13 DIAGNOSIS — D123 Benign neoplasm of transverse colon: Secondary | ICD-10-CM | POA: Diagnosis not present

## 2021-04-13 DIAGNOSIS — R6881 Early satiety: Secondary | ICD-10-CM | POA: Diagnosis not present

## 2021-04-13 DIAGNOSIS — D124 Benign neoplasm of descending colon: Secondary | ICD-10-CM | POA: Diagnosis not present

## 2021-04-27 DIAGNOSIS — D124 Benign neoplasm of descending colon: Secondary | ICD-10-CM | POA: Diagnosis not present

## 2021-04-27 DIAGNOSIS — D120 Benign neoplasm of cecum: Secondary | ICD-10-CM | POA: Diagnosis not present

## 2021-04-27 DIAGNOSIS — D125 Benign neoplasm of sigmoid colon: Secondary | ICD-10-CM | POA: Diagnosis not present

## 2021-04-27 DIAGNOSIS — D122 Benign neoplasm of ascending colon: Secondary | ICD-10-CM | POA: Diagnosis not present

## 2021-04-27 DIAGNOSIS — K2930 Chronic superficial gastritis without bleeding: Secondary | ICD-10-CM | POA: Diagnosis not present

## 2021-04-27 DIAGNOSIS — D123 Benign neoplasm of transverse colon: Secondary | ICD-10-CM | POA: Diagnosis not present

## 2021-10-11 DIAGNOSIS — Z1231 Encounter for screening mammogram for malignant neoplasm of breast: Secondary | ICD-10-CM | POA: Diagnosis not present

## 2021-10-14 DIAGNOSIS — J4550 Severe persistent asthma, uncomplicated: Secondary | ICD-10-CM | POA: Diagnosis not present

## 2021-11-08 DIAGNOSIS — J45909 Unspecified asthma, uncomplicated: Secondary | ICD-10-CM | POA: Diagnosis not present

## 2021-11-08 DIAGNOSIS — J209 Acute bronchitis, unspecified: Secondary | ICD-10-CM | POA: Diagnosis not present

## 2021-11-09 ENCOUNTER — Ambulatory Visit
Admission: RE | Admit: 2021-11-09 | Discharge: 2021-11-09 | Disposition: A | Discharge status: Home / Self Care | Payer: Medicare HMO | Source: Ambulatory Visit | Attending: Family Medicine | Admitting: Family Medicine

## 2021-11-09 ENCOUNTER — Other Ambulatory Visit: Payer: Self-pay | Admitting: Family Medicine

## 2021-11-09 DIAGNOSIS — J209 Acute bronchitis, unspecified: Secondary | ICD-10-CM

## 2021-11-09 DIAGNOSIS — R059 Cough, unspecified: Secondary | ICD-10-CM | POA: Diagnosis not present

## 2021-11-25 DIAGNOSIS — I700 Atherosclerosis of aorta: Secondary | ICD-10-CM | POA: Diagnosis not present

## 2021-11-25 DIAGNOSIS — E559 Vitamin D deficiency, unspecified: Secondary | ICD-10-CM | POA: Diagnosis not present

## 2021-11-25 DIAGNOSIS — G2581 Restless legs syndrome: Secondary | ICD-10-CM | POA: Diagnosis not present

## 2021-11-25 DIAGNOSIS — Z0000 Encounter for general adult medical examination without abnormal findings: Secondary | ICD-10-CM | POA: Diagnosis not present

## 2021-11-25 DIAGNOSIS — J209 Acute bronchitis, unspecified: Secondary | ICD-10-CM | POA: Diagnosis not present

## 2021-11-25 DIAGNOSIS — J45909 Unspecified asthma, uncomplicated: Secondary | ICD-10-CM | POA: Diagnosis not present

## 2021-11-25 DIAGNOSIS — E7800 Pure hypercholesterolemia, unspecified: Secondary | ICD-10-CM | POA: Diagnosis not present

## 2021-11-25 DIAGNOSIS — Z1331 Encounter for screening for depression: Secondary | ICD-10-CM | POA: Diagnosis not present

## 2021-12-15 DIAGNOSIS — Z20822 Contact with and (suspected) exposure to covid-19: Secondary | ICD-10-CM | POA: Diagnosis not present

## 2021-12-15 DIAGNOSIS — J029 Acute pharyngitis, unspecified: Secondary | ICD-10-CM | POA: Diagnosis not present

## 2021-12-15 DIAGNOSIS — R6883 Chills (without fever): Secondary | ICD-10-CM | POA: Diagnosis not present

## 2022-01-13 DIAGNOSIS — R0609 Other forms of dyspnea: Secondary | ICD-10-CM | POA: Diagnosis not present

## 2022-01-26 ENCOUNTER — Encounter: Payer: Self-pay | Admitting: Pulmonary Disease

## 2022-01-26 ENCOUNTER — Ambulatory Visit: Payer: Medicare HMO | Admitting: Pulmonary Disease

## 2022-01-26 VITALS — BP 132/70 | HR 81 | Ht 66.0 in | Wt 168.0 lb

## 2022-01-26 DIAGNOSIS — J4520 Mild intermittent asthma, uncomplicated: Secondary | ICD-10-CM

## 2022-01-26 NOTE — Patient Instructions (Addendum)
Recommend continuing the advair diskus inhaler 1 puff twice daily over the next month and then you can stop the inhaler and monitor for return of symptoms.   Ok to stop the mucinex   Continue zyrtec daily  Follow up in 3 months.

## 2022-01-26 NOTE — Progress Notes (Signed)
Synopsis: Referred in January 2024 for cough and wheezing by Marilynne Drivers, PA  Subjective:   PATIENT ID: Janet Martin GENDER: female DOB: [birth year], MRN: [id number]   HPI  Chief Complaint  Patient presents with   Consult    Referred by PCP for history of asthma. States she has been wheezing and coughing more over the past few months. Productive cough with yellowish phlegm.    Janet Martin is a 72 year old woman, never smoker who is referred to pulmonary clinic for cough and wheezing.   She reports having cough and wheezing that started about 5-6 months ago. She reports she is finally feeling better over recent weeks due to multiple rounds of prednisone. She was started on advair diskus 250-40mg last fall which she continues to take 1 puff twice daily.   She does have acid reflux. She uses TUMS as needed. No issues with seasonal allergies. She had episode of bronchitis 6 years ago.   She reports exercise induced asthma about 19 years ago.   No history of smoking. No second hand smoke. She is working in [employer] No dust or chemical exposures at work. She got a new cat last year.  Past Medical History:  Diagnosis Date   Kidney stone    Nerve pain    in legs, takes citalopram     Family History  Problem Relation Age of Onset   Hypertension Mother    Stroke Mother    Cataracts Father    Hypertension Father    Diabetes Father      Social History   Socioeconomic History   Marital status: Single    Spouse name: Not on file   Number of children: Not on file   Years of education: Not on file   Highest education level: Not on file  Occupational History   Not on file  Tobacco Use   Smoking status: Never   Smokeless tobacco: Never  Substance and Sexual Activity   Alcohol use: Yes    Comment: social   Drug use: No   Sexual activity: Not on file  Other Topics Concern   Not on file  Social History Narrative   Not on file   Social Determinants of Health    Financial Resource Strain: Not on file  Food Insecurity: Not on file  Transportation Needs: Not on file  Physical Activity: Not on file  Stress: Not on file  Social Connections: Not on file  Intimate Partner Violence: Not on file     Allergies  Allergen Reactions   Contrast Media [Iodinated Contrast Media] Hives and Itching    Pt states in 2001 she had a reaction to IV contrast.  She broke out in hives, had itching and redness.   We did not know of her allergy beforehand.  She chose to have it done w/o contrast today. She will need full premeds in the future. J Bohm 06/21/15   Aspirin Nausea Only   Codeine    Epinephrine Other (See Comments)    Caused her jaw to lock up.    Penicillins    Sulfa Antibiotics      Outpatient Medications Prior to Visit  Medication Sig Dispense Refill   ADVAIR DISKUS 250-50 MCG/ACT AEPB Inhale 1 puff into the lungs in the morning and at bedtime.     ALPRAZolam (XANAX) 0.25 MG tablet Take 0.25 mg by mouth at bedtime as needed for anxiety. For cramps     cetirizine (ZYRTEC) 10  MG tablet Take 10 mg by mouth daily.     citalopram (CELEXA) 20 MG tablet Take 20 mg by mouth daily.     No facility-administered medications prior to visit.    Review of Systems  Constitutional:  Negative for chills, fever, malaise/fatigue and weight loss.  HENT:  Negative for congestion, sinus pain and sore throat.   Eyes: Negative.   Respiratory:  Positive for cough and wheezing. Negative for hemoptysis, sputum production and shortness of breath.   Cardiovascular:  Negative for chest pain, palpitations, orthopnea, claudication and leg swelling.  Gastrointestinal:  Negative for abdominal pain, heartburn, nausea and vomiting.  Genitourinary: Negative.   Musculoskeletal:  Negative for joint pain and myalgias.  Skin:  Negative for rash.  Neurological:  Negative for weakness.  Endo/Heme/Allergies: Negative.   Psychiatric/Behavioral: Negative.     Objective:   Vitals:    01/26/22 1425  BP: 132/70  Pulse: 81  SpO2: 97%  Weight: 168 lb (76.2 kg)  Height: '5\' 6"'$  (1.676 m)   Physical Exam Constitutional:      General: She is not in acute distress.    Appearance: She is not ill-appearing.  HENT:     Head: Normocephalic and atraumatic.  Eyes:     General: No scleral icterus.    Conjunctiva/sclera: Conjunctivae normal.     Pupils: Pupils are equal, round, and reactive to light.  Cardiovascular:     Rate and Rhythm: Normal rate and regular rhythm.     Pulses: Normal pulses.     Heart sounds: Normal heart sounds. No murmur heard. Pulmonary:     Effort: Pulmonary effort is normal.     Breath sounds: Normal breath sounds. No wheezing, rhonchi or rales.  Abdominal:     General: Bowel sounds are normal.     Palpations: Abdomen is soft.  Musculoskeletal:     Right lower leg: No edema.     Left lower leg: No edema.  Lymphadenopathy:     Cervical: No cervical adenopathy.  Skin:    General: Skin is warm and dry.  Neurological:     General: No focal deficit present.     Mental Status: She is alert.  Psychiatric:        Mood and Affect: Mood normal.        Behavior: Behavior normal.        Thought Content: Thought content normal.        Judgment: Judgment normal.    CBC No results found for: "WBC", "RBC", "HGB", "HCT", "PLT", "MCV", "MCH", "MCHC", "RDW", "LYMPHSABS", "MONOABS", "EOSABS", "BASOSABS"   Chest imaging: CXR 11/09/21 The heart size and mediastinal contours are within normal limits. Both lungs are clear. The visualized skeletal structures are unremarkable.  PFT:     No data to display          Labs:  Path:  Echo:  Heart Catheterization:  Assessment & Plan:   Mild intermittent reactive airway disease without complication  Discussion: Janet Martin is a 72 year old woman, never smoker who is referred to pulmonary clinic for cough and wheezing.   She appears to have mild intermittent reactive airways disease that has  resolved with advair diskus 250-35mg 1 puff twice daily. She is to continue this inhaler for 1 more month and then stop using the inhaler scheduled, monitor for return of symptoms.   She is to stop mucinex. She is to continue zyrtec daily.   Follow up in 3 months.  JFreda Jackson MD   Pulmonary & Critical Care Office: [phone]    Current Outpatient Medications:    ADVAIR DISKUS 250-50 MCG/ACT AEPB, Inhale 1 puff into the lungs in the morning and at bedtime., Disp: , Rfl:    ALPRAZolam (XANAX) 0.25 MG tablet, Take 0.25 mg by mouth at bedtime as needed for anxiety. For cramps, Disp: , Rfl:    cetirizine (ZYRTEC) 10 MG tablet, Take 10 mg by mouth daily., Disp: , Rfl:    citalopram (CELEXA) 20 MG tablet, Take 20 mg by mouth daily., Disp: , Rfl:

## 2022-05-16 ENCOUNTER — Telehealth: Payer: Self-pay | Admitting: Pulmonary Disease

## 2022-05-16 ENCOUNTER — Ambulatory Visit: Payer: Medicare HMO | Admitting: Pulmonary Disease

## 2022-05-16 ENCOUNTER — Encounter: Payer: Self-pay | Admitting: Pulmonary Disease

## 2022-05-16 VITALS — BP 124/70 | HR 70 | Ht 66.0 in | Wt 168.0 lb

## 2022-05-16 DIAGNOSIS — J4520 Mild intermittent asthma, uncomplicated: Secondary | ICD-10-CM | POA: Diagnosis not present

## 2022-05-16 NOTE — Patient Instructions (Addendum)
Resume advair 250-37mcg 1 puff twice daily - rinse mouth out after each use  Use albuterol inhaler as needed, 1-2 puffs every 4-6 hours as needed  Take allegra D, daily for allergies.   Follow up in 3 months.

## 2022-05-16 NOTE — Progress Notes (Signed)
Synopsis: Referred in January 2024 for cough and wheezing by Horton Marshall, PA  Subjective:   PATIENT ID: Janet Martin GENDER: female DOB: 07-30-1950, MRN: [id number]  HPI  Chief Complaint  Patient presents with   Follow-up    4 mo f/u for cough. Productive cough returned a few weeks ago. Has been coughing up green phlegm. Increased wheezing at night.    Janet Martin is a 72 year old woman, never smoker who is referred to pulmonary clinic for reactive airways disease.   Initial OV 01/26/22 She reports having cough and wheezing that started about 5-6 months ago. She reports she is finally feeling better over recent weeks due to multiple rounds of prednisone. She was started on advair diskus 250-3mcg last fall which she continues to take 1 puff twice daily.   She does have acid reflux. She uses TUMS as needed. No issues with seasonal allergies. She had episode of bronchitis 6 years ago.   She reports exercise induced asthma about 19 years ago.   No history of smoking. No second hand smoke. She is working in [employer]. No dust or chemical exposures at work. She got a new cat last year.  OV 05/16/22 No fevers chills or sweats. She is extremely tired. She has return of cough and wheezing. She started taking zyrtec as needed for allergies. She tapered off her inhaler as instructed in February. She has not resumed her inhaler yet.   Past Medical History:  Diagnosis Date   Kidney stone    Nerve pain    in legs, takes citalopram     Family History  Problem Relation Age of Onset   Hypertension Mother    Stroke Mother    Cataracts Father    Hypertension Father    Diabetes Father      Social History   Socioeconomic History   Marital status: Single    Spouse name: Not on file   Number of children: Not on file   Years of education: Not on file   Highest education level: Not on file  Occupational History   Not on file  Tobacco Use   Smoking status: Never   Smokeless  tobacco: Never  Substance and Sexual Activity   Alcohol use: Yes    Comment: social   Drug use: No   Sexual activity: Not on file  Other Topics Concern   Not on file  Social History Narrative   Not on file   Social Determinants of Health   Financial Resource Strain: Not on file  Food Insecurity: Not on file  Transportation Needs: Not on file  Physical Activity: Not on file  Stress: Not on file  Social Connections: Not on file  Intimate Partner Violence: Not on file     Allergies  Allergen Reactions   Contrast Media [Iodinated Contrast Media] Hives and Itching    Pt states in 2001 she had a reaction to IV contrast.  She broke out in hives, had itching and redness.   We did not know of her allergy beforehand.  She chose to have it done w/o contrast today. She will need full premeds in the future. J Bohm 06/21/15   Aspirin Nausea Only   Codeine    Epinephrine Other (See Comments)    Caused her jaw to lock up.    Penicillins    Sulfa Antibiotics      Outpatient Medications Prior to Visit  Medication Sig Dispense Refill   ADVAIR DISKUS 250-50  MCG/ACT AEPB Inhale 1 puff into the lungs in the morning and at bedtime.     ALPRAZolam (XANAX) 0.25 MG tablet Take 0.25 mg by mouth at bedtime as needed for anxiety. For cramps     cetirizine (ZYRTEC) 10 MG tablet Take 10 mg by mouth daily.     citalopram (CELEXA) 20 MG tablet Take 20 mg by mouth daily.     No facility-administered medications prior to visit.    Review of Systems  Constitutional:  Negative for chills, fever, malaise/fatigue and weight loss.  HENT:  Negative for congestion, sinus pain and sore throat.   Eyes: Negative.   Respiratory:  Positive for cough and wheezing. Negative for hemoptysis, sputum production and shortness of breath.   Cardiovascular:  Negative for chest pain, palpitations, orthopnea, claudication and leg swelling.  Gastrointestinal:  Negative for abdominal pain, heartburn, nausea and vomiting.   Genitourinary: Negative.   Musculoskeletal:  Negative for joint pain and myalgias.  Skin:  Negative for rash.  Neurological:  Negative for weakness.  Endo/Heme/Allergies: Negative.   Psychiatric/Behavioral: Negative.     Objective:   Vitals:   05/16/22 1447  BP: 124/70  Pulse: 70  SpO2: 97%  Weight: 168 lb (76.2 kg)  Height: 5\' 6"  (1.676 m)    Physical Exam Constitutional:      General: She is not in acute distress.    Appearance: She is not ill-appearing.  HENT:     Head: Normocephalic and atraumatic.  Eyes:     General: No scleral icterus.    Conjunctiva/sclera: Conjunctivae normal.  Cardiovascular:     Rate and Rhythm: Normal rate and regular rhythm.     Pulses: Normal pulses.     Heart sounds: Normal heart sounds. No murmur heard. Pulmonary:     Effort: Pulmonary effort is normal.     Breath sounds: Wheezing present. No rhonchi or rales.  Musculoskeletal:     Right lower leg: No edema.     Left lower leg: No edema.  Skin:    General: Skin is warm and dry.  Neurological:     General: No focal deficit present.     Mental Status: She is alert.    CBC No results found for: "WBC", "RBC", "HGB", "HCT", "PLT", "MCV", "MCH", "MCHC", "RDW", "LYMPHSABS", "MONOABS", "EOSABS", "BASOSABS"   Chest imaging: CXR 11/09/21 The heart size and mediastinal contours are within normal limits. Both lungs are clear. The visualized skeletal structures are unremarkable.  PFT:     No data to display          Labs:  Path:  Echo:  Heart Catheterization:  Assessment & Plan:   Mild intermittent asthma without complication  Discussion: Janet Martin is a 72 year old woman, never smoker who returns to pulmonary clinic for asthma.   She has mild intermittent asthma with on going cough and wheezing since tapering off her scheduled inhaler use. She is to resume advair 250-61mcg 1 puff twice daily. She can use albuterol inhaler as needed.   She can use allegra-D for  allergies.   Follow up in 3 months.  Janet Comas, MD [facility] Office: [phone]    Current Outpatient Medications:    ADVAIR DISKUS 250-50 MCG/ACT AEPB, Inhale 1 puff into the lungs in the morning and at bedtime., Disp: , Rfl:    ALPRAZolam (XANAX) 0.25 MG tablet, Take 0.25 mg by mouth at bedtime as needed for anxiety. For cramps, Disp: , Rfl:    cetirizine (  ZYRTEC) 10 MG tablet, Take 10 mg by mouth daily., Disp: , Rfl:    citalopram (CELEXA) 20 MG tablet, Take 20 mg by mouth daily., Disp: , Rfl:

## 2022-08-16 ENCOUNTER — Other Ambulatory Visit: Payer: Self-pay | Admitting: Family Medicine

## 2022-08-16 ENCOUNTER — Ambulatory Visit
Admission: RE | Admit: 2022-08-16 | Discharge: 2022-08-16 | Disposition: A | Discharge status: Home / Self Care | Payer: Medicare HMO | Source: Ambulatory Visit | Attending: Family Medicine | Admitting: Family Medicine

## 2022-08-16 DIAGNOSIS — R1032 Left lower quadrant pain: Secondary | ICD-10-CM

## 2022-08-16 DIAGNOSIS — K5730 Diverticulosis of large intestine without perforation or abscess without bleeding: Secondary | ICD-10-CM | POA: Diagnosis not present

## 2022-08-16 DIAGNOSIS — R110 Nausea: Secondary | ICD-10-CM | POA: Diagnosis not present

## 2022-08-16 DIAGNOSIS — R140 Abdominal distension (gaseous): Secondary | ICD-10-CM | POA: Diagnosis not present

## 2022-08-16 DIAGNOSIS — J4540 Moderate persistent asthma, uncomplicated: Secondary | ICD-10-CM | POA: Diagnosis not present

## 2022-08-21 DIAGNOSIS — M549 Dorsalgia, unspecified: Secondary | ICD-10-CM | POA: Diagnosis not present

## 2022-08-21 DIAGNOSIS — R399 Unspecified symptoms and signs involving the genitourinary system: Secondary | ICD-10-CM | POA: Diagnosis not present

## 2022-08-21 DIAGNOSIS — R310 Gross hematuria: Secondary | ICD-10-CM | POA: Diagnosis not present

## 2022-08-28 DIAGNOSIS — R399 Unspecified symptoms and signs involving the genitourinary system: Secondary | ICD-10-CM | POA: Diagnosis not present

## 2022-10-10 DIAGNOSIS — Z1231 Encounter for screening mammogram for malignant neoplasm of breast: Secondary | ICD-10-CM | POA: Diagnosis not present

## 2023-02-23 DIAGNOSIS — E673 Hypervitaminosis D: Secondary | ICD-10-CM | POA: Diagnosis not present

## 2023-03-23 DIAGNOSIS — D123 Benign neoplasm of transverse colon: Secondary | ICD-10-CM | POA: Diagnosis not present

## 2023-03-23 DIAGNOSIS — K222 Esophageal obstruction: Secondary | ICD-10-CM | POA: Diagnosis not present

## 2023-03-23 DIAGNOSIS — Z09 Encounter for follow-up examination after completed treatment for conditions other than malignant neoplasm: Secondary | ICD-10-CM | POA: Diagnosis not present

## 2023-03-23 DIAGNOSIS — K2930 Chronic superficial gastritis without bleeding: Secondary | ICD-10-CM | POA: Diagnosis not present

## 2023-03-23 DIAGNOSIS — Z800 Family history of malignant neoplasm of digestive organs: Secondary | ICD-10-CM | POA: Diagnosis not present

## 2023-03-23 DIAGNOSIS — Z860101 Personal history of adenomatous and serrated colon polyps: Secondary | ICD-10-CM | POA: Diagnosis not present

## 2023-03-23 DIAGNOSIS — R1013 Epigastric pain: Secondary | ICD-10-CM | POA: Diagnosis not present

## 2023-03-27 DIAGNOSIS — D123 Benign neoplasm of transverse colon: Secondary | ICD-10-CM | POA: Diagnosis not present

## 2023-03-27 DIAGNOSIS — K2930 Chronic superficial gastritis without bleeding: Secondary | ICD-10-CM | POA: Diagnosis not present

## 2023-04-01 IMAGING — MG MM DIGITAL SCREENING BILAT W/ TOMO AND CAD
8 series · 8 of 24 positions shown · non-contrast
Comparison: Previous exam(s).

CLINICAL DATA: Screening.

EXAM:
DIGITAL SCREENING BILATERAL MAMMOGRAM WITH TOMOSYNTHESIS AND CAD
TECHNIQUE: Bilateral screening digital craniocaudal and mediolateral oblique
mammograms were obtained. Bilateral screening digital breast
tomosynthesis was performed. The images were evaluated with
computer-aided detection.

[R CC synth-2D]
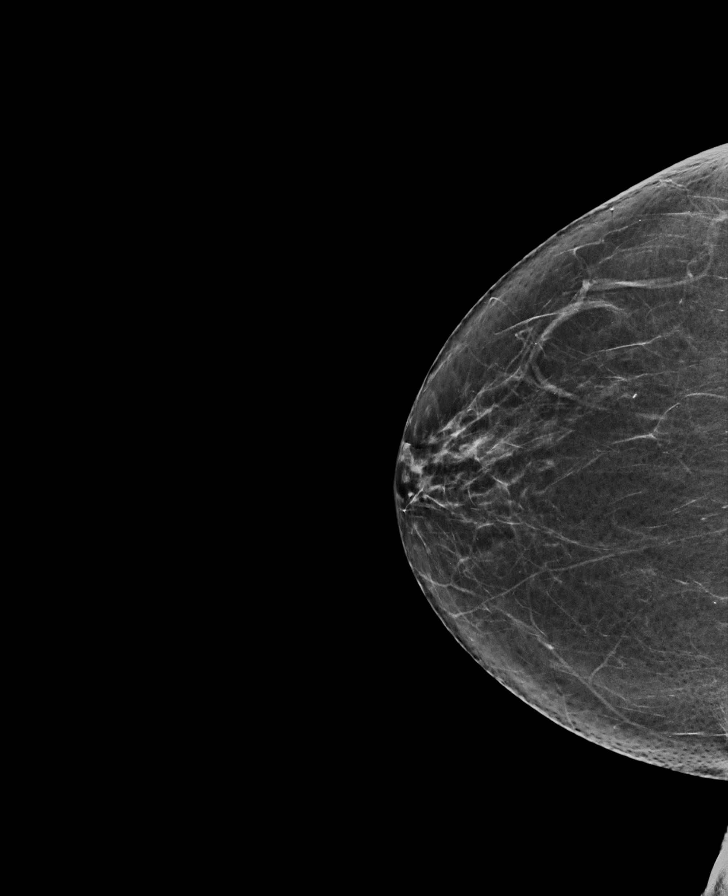

[L CC synth-2D]
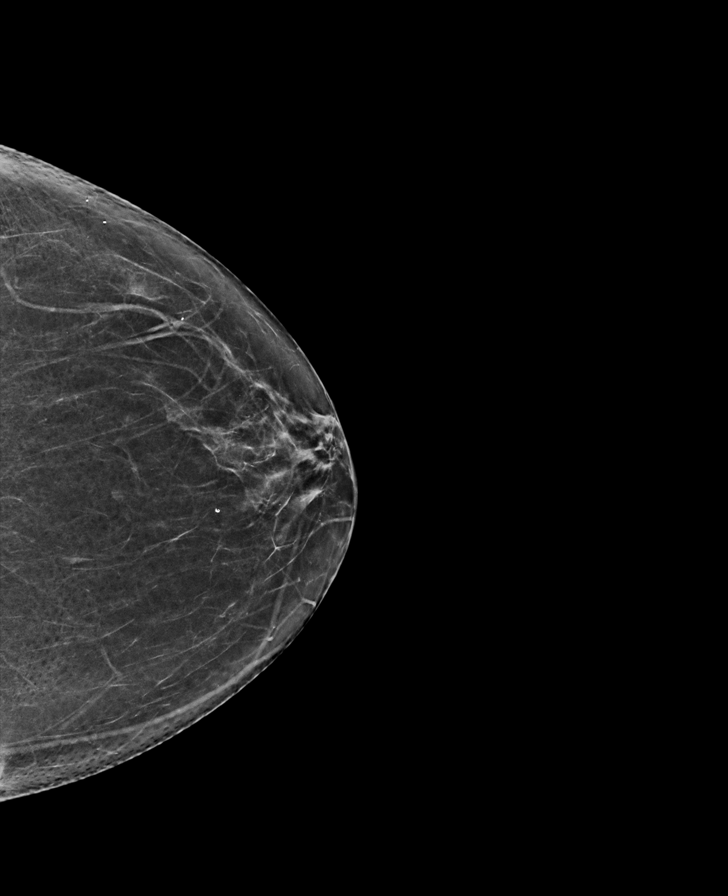

[L MLO synth-2D]
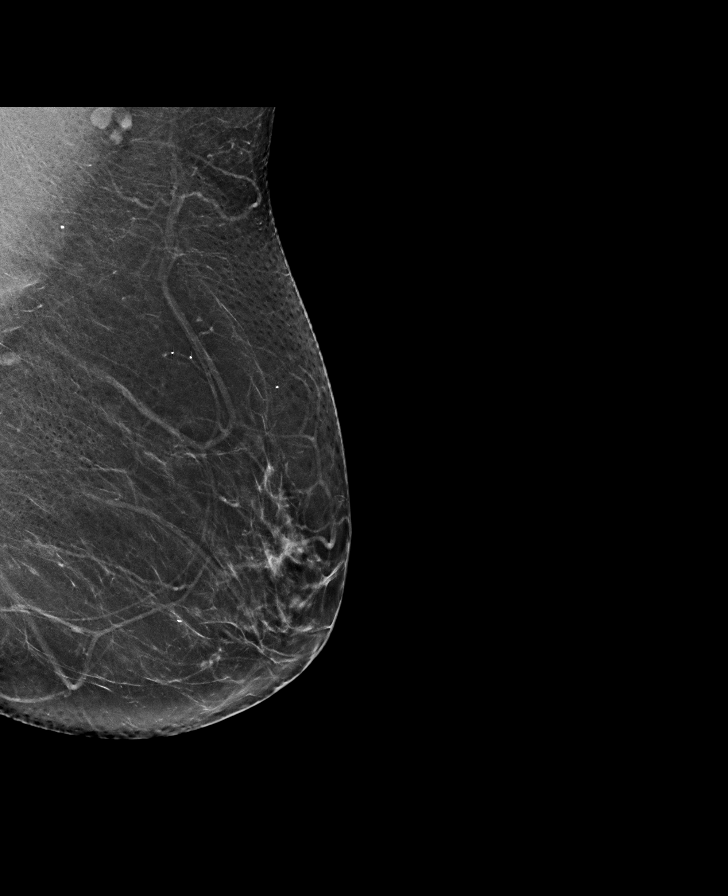

[R MLO synth-2D]
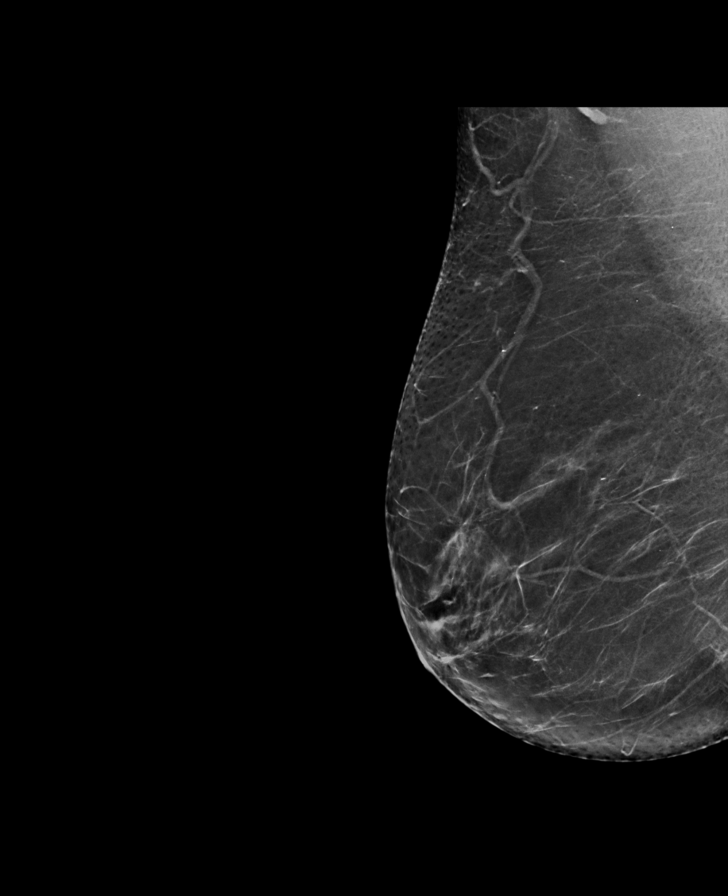

[R MLO tomo · tomo slice 37/73.0]
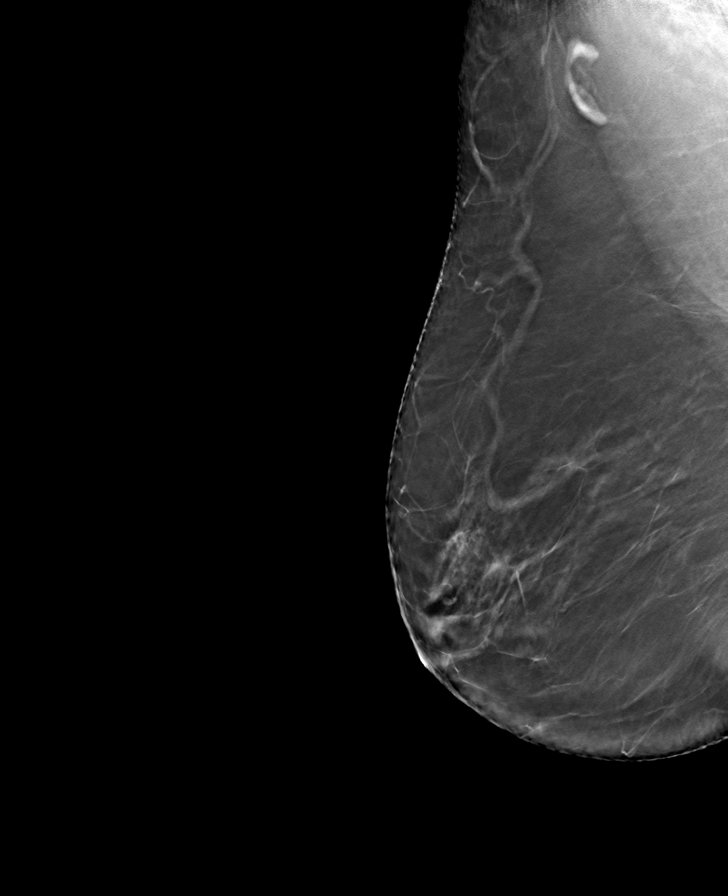

[R CC tomo · tomo slice 32/63.0]
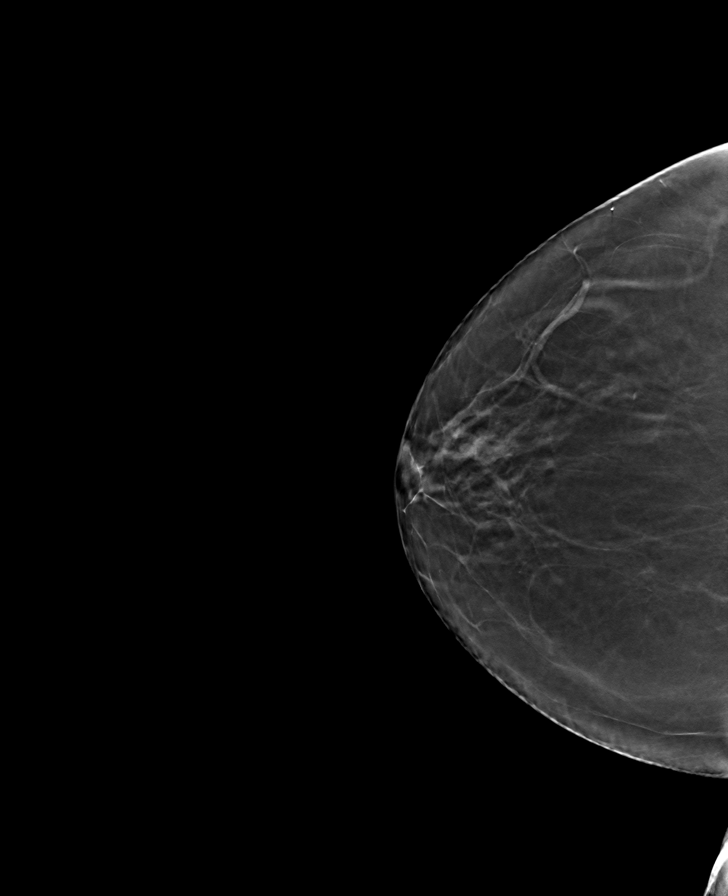

[L MLO tomo · tomo slice 35/70.0]
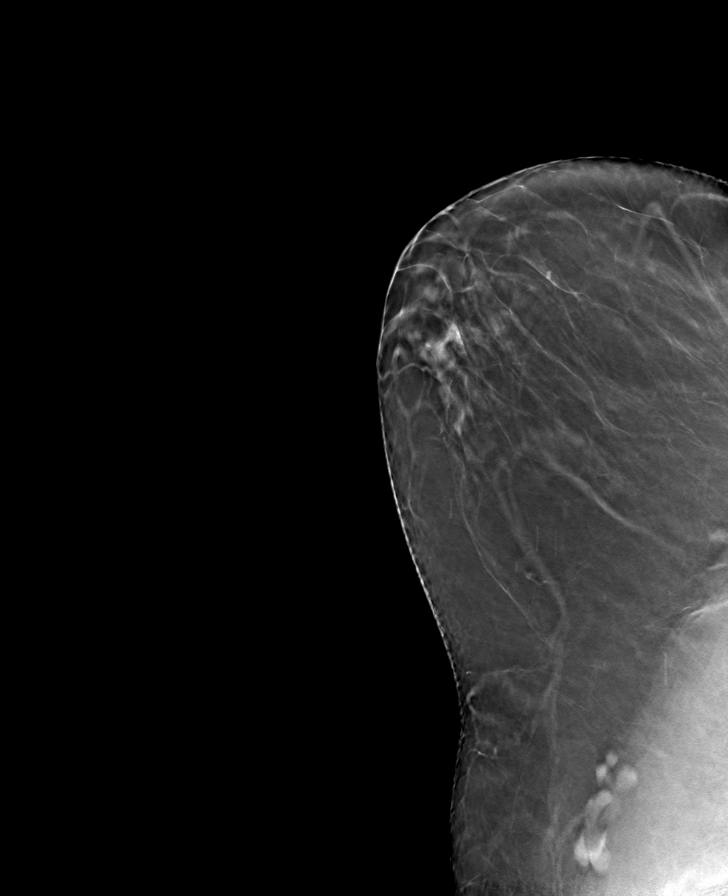

[L CC tomo · tomo slice 31/60.0]
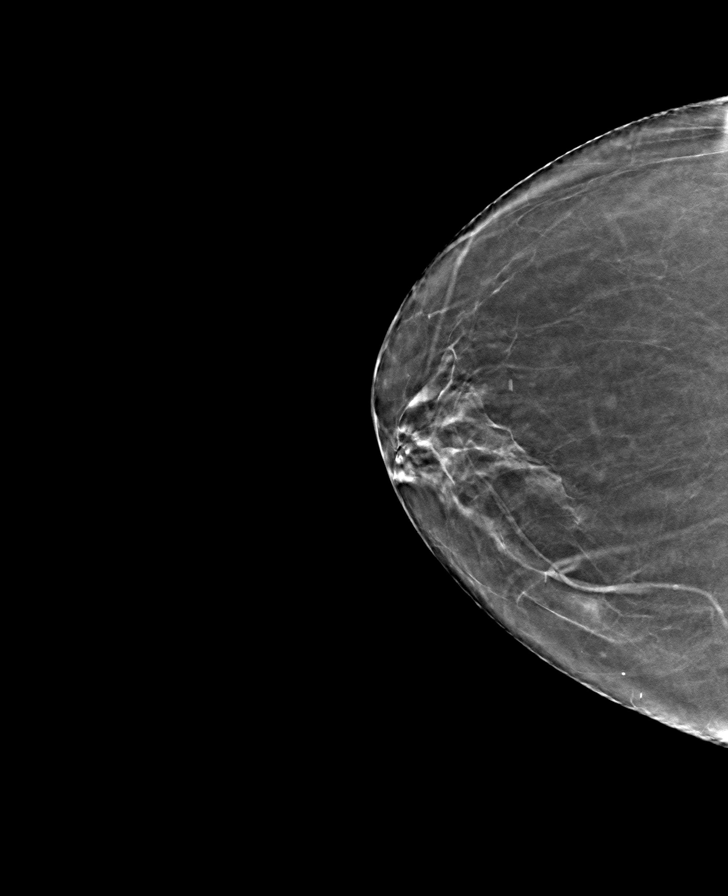

[8 of 24 positions shown; findings below may reference images not displayed]

ACR Breast Density Category b: There are scattered areas of
fibroglandular density.
FINDINGS: There are no findings suspicious for malignancy.
IMPRESSION: No mammographic evidence of malignancy. A result letter of this
screening mammogram will be mailed directly to the patient.

RECOMMENDATION:
Screening mammogram in one year. (Code:51-O-LD2)

BI-RADS CATEGORY  1: Negative.

## 2023-04-30 DIAGNOSIS — Z131 Encounter for screening for diabetes mellitus: Secondary | ICD-10-CM | POA: Diagnosis not present

## 2023-04-30 DIAGNOSIS — E559 Vitamin D deficiency, unspecified: Secondary | ICD-10-CM | POA: Diagnosis not present

## 2023-04-30 DIAGNOSIS — R252 Cramp and spasm: Secondary | ICD-10-CM | POA: Diagnosis not present

## 2023-04-30 DIAGNOSIS — I10 Essential (primary) hypertension: Secondary | ICD-10-CM | POA: Diagnosis not present

## 2023-05-29 DIAGNOSIS — D225 Melanocytic nevi of trunk: Secondary | ICD-10-CM | POA: Diagnosis not present

## 2023-05-29 DIAGNOSIS — L814 Other melanin hyperpigmentation: Secondary | ICD-10-CM | POA: Diagnosis not present

## 2023-05-29 DIAGNOSIS — L820 Inflamed seborrheic keratosis: Secondary | ICD-10-CM | POA: Diagnosis not present

## 2023-05-29 DIAGNOSIS — L538 Other specified erythematous conditions: Secondary | ICD-10-CM | POA: Diagnosis not present

## 2023-05-29 DIAGNOSIS — L821 Other seborrheic keratosis: Secondary | ICD-10-CM | POA: Diagnosis not present

## 2023-07-04 DIAGNOSIS — Z79899 Other long term (current) drug therapy: Secondary | ICD-10-CM | POA: Diagnosis not present

## 2023-07-04 DIAGNOSIS — I959 Hypotension, unspecified: Secondary | ICD-10-CM | POA: Diagnosis not present

## 2023-07-25 DIAGNOSIS — E119 Type 2 diabetes mellitus without complications: Secondary | ICD-10-CM | POA: Diagnosis not present

## 2023-07-25 DIAGNOSIS — E7800 Pure hypercholesterolemia, unspecified: Secondary | ICD-10-CM | POA: Diagnosis not present

## 2023-07-25 DIAGNOSIS — J4550 Severe persistent asthma, uncomplicated: Secondary | ICD-10-CM | POA: Diagnosis not present

## 2023-07-25 DIAGNOSIS — I10 Essential (primary) hypertension: Secondary | ICD-10-CM | POA: Diagnosis not present

## 2023-07-25 DIAGNOSIS — F411 Generalized anxiety disorder: Secondary | ICD-10-CM | POA: Diagnosis not present

## 2023-10-12 DIAGNOSIS — B372 Candidiasis of skin and nail: Secondary | ICD-10-CM | POA: Diagnosis not present

## 2023-10-12 DIAGNOSIS — F411 Generalized anxiety disorder: Secondary | ICD-10-CM | POA: Diagnosis not present

## 2023-10-12 DIAGNOSIS — L309 Dermatitis, unspecified: Secondary | ICD-10-CM | POA: Diagnosis not present

## 2023-10-12 DIAGNOSIS — G2581 Restless legs syndrome: Secondary | ICD-10-CM | POA: Diagnosis not present
